# Patient Record
Sex: Male | Born: 1995 | Race: White | Hispanic: No | Marital: Single
Health system: Western US, Academic
[De-identification: ages and names within clinical notes are randomized; demographics above are authoritative.]

---

## 2019-11-08 ENCOUNTER — Emergency Department
Admission: EM | Admit: 2019-11-08 | Discharge: 2019-11-08 | Disposition: A | Discharge status: Home / Self Care | Payer: Medicaid Other | Attending: Emergency Medicine | Admitting: Emergency Medicine

## 2019-11-08 ENCOUNTER — Emergency Department (EMERGENCY_DEPARTMENT_HOSPITAL): Payer: Medicaid Other

## 2019-11-08 DIAGNOSIS — M79674 Pain in right toe(s): Secondary | ICD-10-CM

## 2019-11-08 DIAGNOSIS — W228XXA Striking against or struck by other objects, initial encounter: Secondary | ICD-10-CM

## 2019-11-08 DIAGNOSIS — S90111A Contusion of right great toe without damage to nail, initial encounter: Secondary | ICD-10-CM | POA: Insufficient documentation

## 2019-11-08 DIAGNOSIS — S99921A Unspecified injury of right foot, initial encounter: Secondary | ICD-10-CM | POA: Insufficient documentation

## 2019-11-08 DIAGNOSIS — W208XXA Other cause of strike by thrown, projected or falling object, initial encounter: Secondary | ICD-10-CM | POA: Insufficient documentation

## 2019-11-08 MED ORDER — ACETAMINOPHEN 325 MG PO TABS
975.0000 mg | ORAL_TABLET | Freq: Once | ORAL | Status: AC
Start: 2019-11-08 — End: 2019-11-08
  Administered 2019-11-08: 21:00:00 975 mg via ORAL
  Filled 2019-11-08: qty 3

## 2019-11-08 MED ORDER — IBUPROFEN 600 MG OR TABS
600.0000 mg | ORAL_TABLET | Freq: Once | ORAL | Status: AC
Start: 2019-11-08 — End: 2019-11-08
  Administered 2019-11-08: 600 mg via ORAL
  Filled 2019-11-08: qty 1

## 2019-11-08 NOTE — ED Provider Notes (Signed)
Emergency Department Note  [facility] electronic medical record reviewed for pertinent medical history.     Patient: Joseph Tapia, MRN [id number], DOB [birth year]  The Date of Service for the Emergency Room encounter is 11/08/2019  6:19 PM     Nursing Triage Note :   Chief Complaint   Patient presents with   . Toe Pain     pt to ER for eval of right big toe injury. pt reports dropping large pile of wood on toe; +bruising and increased pain.       HPI :   Joseph Tapia is a 24 year old male with no PMHx who presents with right toe swelling and pain after dropping a wood sheet on his toe 5 days ago. Denies other injuries. Patient states swelling and pain has improved since accident occurred. Able to ambulate and bare weight. No other injuries. No prior injuries to R foot. No open wounds.     Denies numbness/ tingling/ weakness/ cyanosis/ gait or balance issues    Denies F/ N/ V/ D/ diaphoresis/ chills/ CP/ palpitations/ SOB/ DOE/ PND/ orthopnea/ LE swelling/ cough/ congestion/ sore throat/ HA/ neck pain or stiffness/ photophobia/ blurry or double vision/ recent unexplained weight loss/ hematochezia/ dysuria/ hematuria/     Past Medical History : reviewed    Past Surgical history : reviewed    Family History: reviewed    Social History:  Social History     Tobacco Use   . Smoking status: Not on file   Substance Use Topics   . Alcohol use: Not on file   . Drug use: Not on file     Housed    Medications:   None       Allergies: Patient has no known allergies.    Review of Systems:  Complete review of 12 systems reviewed and negative unless otherwise noted in the HPI or above. This was done per my custom and practice for systems appropriate to the chief complaint in an emergency department setting and varies depending on the quality of history that the patient is able to provide. History reviewed today as available from records and EPIC chart.      Physical Exam:   11/08/19  1810   BP: 129/57   Pulse: 75   Resp: 16   Temp: 98.5  F (36.9 C)   SpO2: 96%       Nursing note and vitals reviewed. Pt not hypoxic.  Gen: Patient is in NAD, non-toxic appearing, cooperative, GCS 15, A/Ox4  HEENT: NC/AT, PERRL, EOMI, MMM, no conjunctival injection, b/l sclera anicteric, oropharynx clear, no LAD  Neck: Supple. No c-spine tenderness, no step offs or deformities; No meningeal signs.  Cardiovascular: RRR no m/r/g, normal heart sounds, no JVD  Pulmonary/Chest: CTAB, no increased WOB, no respiratory distress, no wheezes/rhonchi/rales, no chest wall tenderness.   Abdominal: Soft. NT/ND, +BS, no r/g, no masses   Back: No T/L-spine tenderness, no step-offs or deformities; no CVAT   Neuro: Awake, alert, and oriented. CN II-XII normal. Speech normal. Ambulates with steady gait.   Psychiatric: Normal affect. Mood not labile nor depressed.   Skin: No rashes, lesions, or wounds. +ecchymosis over R big toe    RLE: No gross deformities, abrasions, or other obvious signs of trauma; FROM and strength 5/5 throughout; neg logroll; neg straight leg raise; no knee/ankle/hip laxity; SILT sural/saphenous/sup peroneal/deep peroneal/post tib distributions; 2+ DP/PT pulse, cap refill <3secs; + ttp dorsal surface R big toe      ED  Course & Clinical Decision Making:  24 year old  male with PMHx as listed in HPI presents with R big toe pain, swelling, and ecchymosis after dropping a heavy object on his toe 5 days ago. Patient presents hemodynamically stable and with physical exam findings as stated above. Patient is neurovascularly intact, no open wounds. Able to ambulate independently.     DDx: R big toe sprain or fracture; neurovascular intact on exam, no open wounds to indicate open fracture, soft compartment without pallor and strong pulses making compartment syndrome unlikely at this time. Doubt osteomyelitis, isolated soft tissue infection, or septic joint. No apparent injury proximal to site of injury.     Initial workup to include XR imaging as listed below. Patient  provided PO analgesics as tolerating well. Will continue to reassess patient with serial neurovascular checks while in the ER and will adjust workup based on initial labs and imaging. No indication for emergent orthopedics consult in ER until initial imaging resulted. No signs of skin tenting or open fracture. Patient advised restricted movements of injury until reevaluation.     Orders Placed This Encounter   Procedures   . X-Ray Foot Complete Minimum 3 Views - Right     Medications   acetaminophen (TYLENOL) tablet 975 mg (975 mg Oral Given 11/08/19 2035)   ibuprofen (MOTRIN) tablet 600 mg (600 mg Oral Given 11/08/19 2035)       Diagnostic Imaging Studies  X-ray Foot Complete Minimum 3 Views - Right    Result Date: 11/08/2019  Narrative: EXAM DESCRIPTION: X-RAY FOOT COMPLETE MINIMUM 3 VIEWS - RIGHT CLINICAL HISTORY: R great toe injury TECHNIQUE: Three views of the right foot COMPARISON: None available. FINDINGS: No fracture, malalignment or joint effusion is seen. Soft tissues are unremarkable. Os peroneum is noted. CONCURRENT SUPERVISION: I have reviewed the images and agree with the fellow's report.    Preliminary created by: Kirstie Mirza Signed by: Leonette Monarch 11/08/2019 20:43:46    Impression: IMPRESSION: No radiographic evidence of acute osseous abnormality.       Workup Review as of Nov 07 2046   Forestine Chute Tiptanya's Documentation   Tue Nov 08, 2019   2026 XR R foot: Radpre: No radiographic evidence of acute osseous abnormality.         - provided with pain meds and hard sole shoe  - follow up with PCP  - I reevaluated the patient. The patient was discharged home, status improved, with instructions regarding supportive care, medications, and appropriate follow up. I discussed indications for seeking immediate medical attention, and the patient voiced understanding. The patient was discharged home in stable condition after all questions were answered.        I have discussed my thought process and plan for the  patient with the attending physician, Laverle Patter,*.    Forestine Chute, MD  Emergency Medicine Resident, PGY2  [facility] [place], Nestor Ramp, MD  Resident  11/08/19 2325       Laverle Patter, MD  11/14/19 Einar Crow

## 2019-11-08 NOTE — Discharge Instructions (Signed)
Your x-ray was negative for a fracture. Please wear the hard sole shoe and bear weight as tolerated. Please follow up with your primary care doctor    Please return to the Emergency Department if you experience chest pain, shortness of breath, fever, chills, or you have any other concerns.

## 2020-06-06 IMAGING — MR MRI BRAIN WO CONTRAST
7 series · 48 of 48 positions shown · non-contrast
Comparison: None.

HISTORY: Headaches
TECHNIQUE: Multiplanar, multisequential MRI images of the brain are obtained without intravenous contrast.

[Series 5: t1_sag · sagittal · 5.0mm · 0.72mm/px · 6 of 25 slices shown]
[im 1/25]
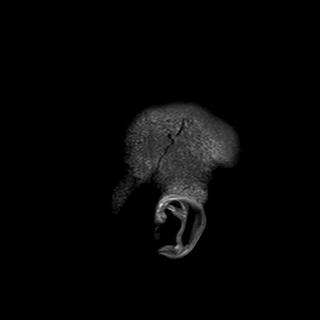
[im 5/25]
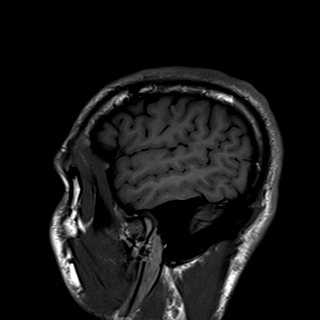
[im 10/25]
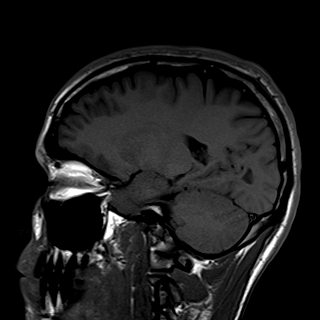
[im 15/25]
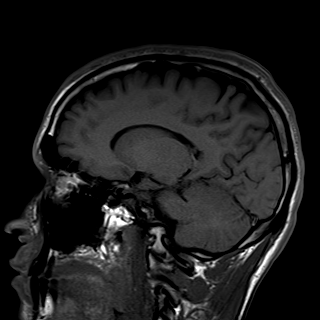
[im 20/25]
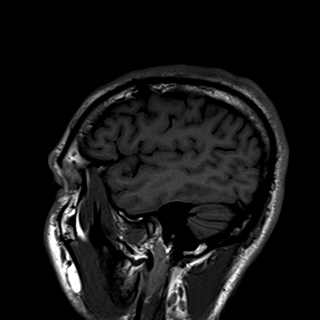
[im 25/25]
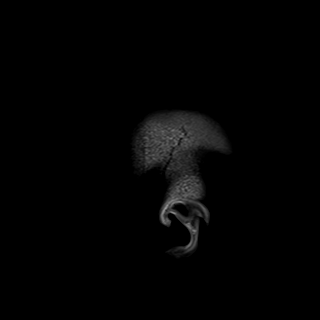

[Series 6: flair_axial_fs · axial · 4.0mm · 0.90mm/px · z∈[+51,+201]mm · 7 of 30 slices shown]
[im 1/30]
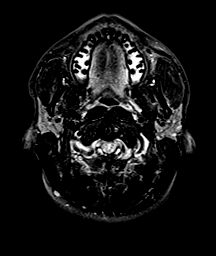
[im 5/30]
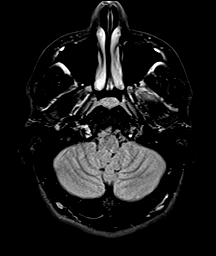
[im 10/30]
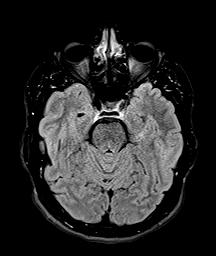
[im 15/30]
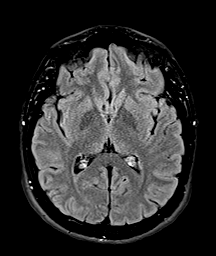
[im 20/30]
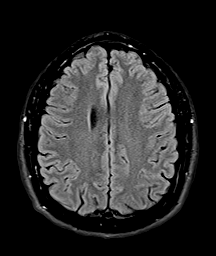
[im 25/30]
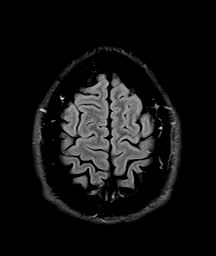
[im 30/30]
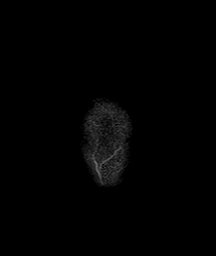

[Series 7: t2_axial · axial · 4.0mm · 0.36mm/px · z∈[+51,+201]mm · 7 of 30 slices shown]
[im 1/30]
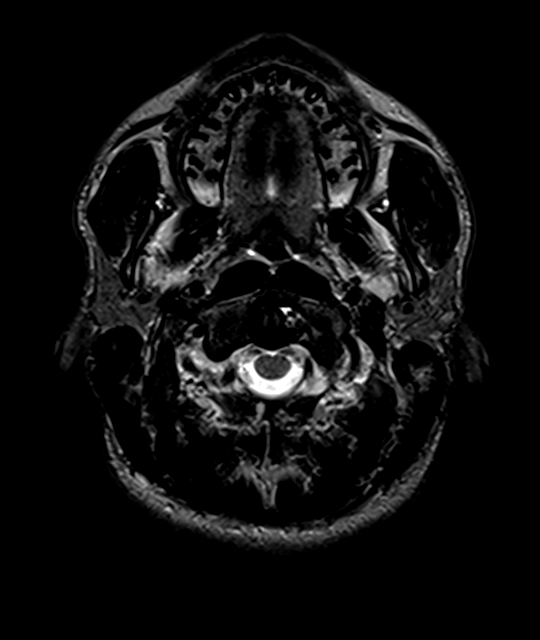
[im 5/30]
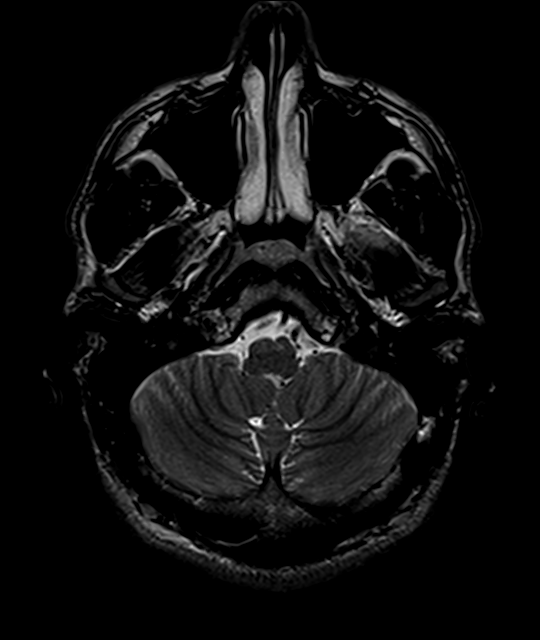
[im 10/30]
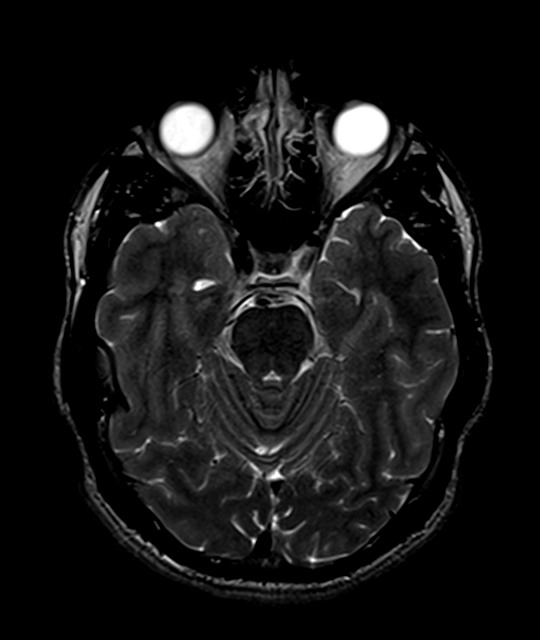
[im 15/30]
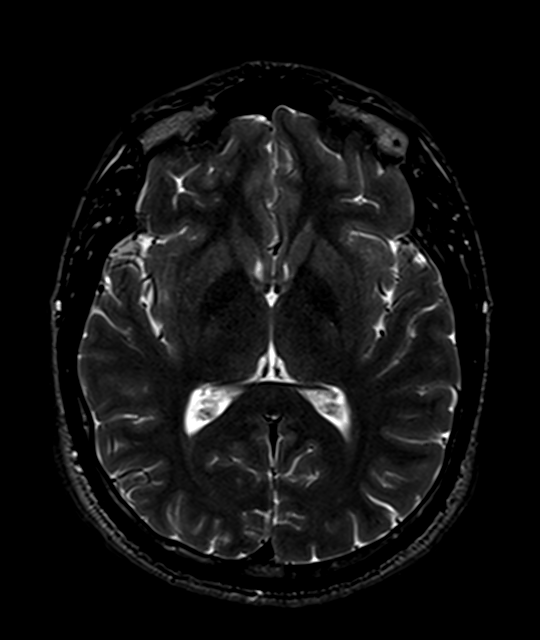
[im 20/30]
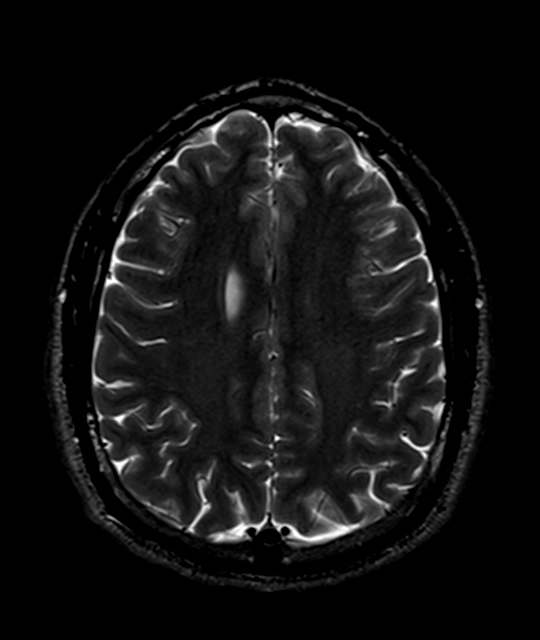
[im 25/30]
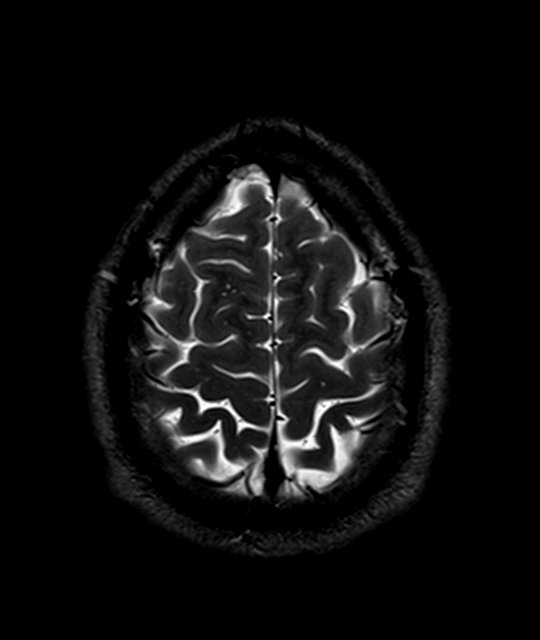
[im 30/30]
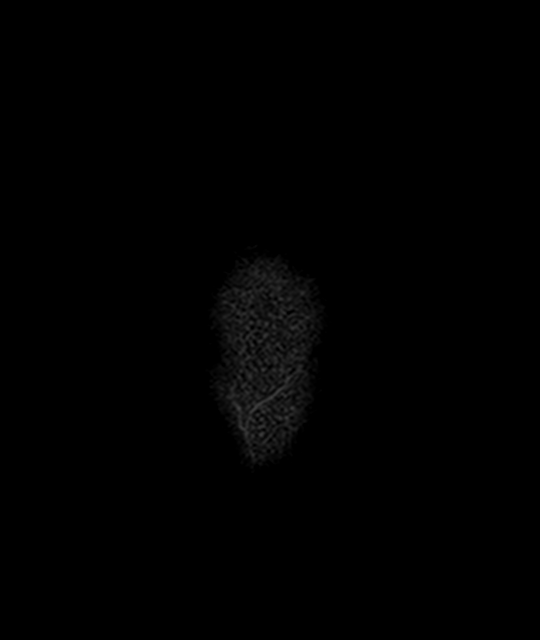

[Series 8: t1_axial · axial · 4.0mm · 0.72mm/px · z∈[+51,+201]mm · 7 of 30 slices shown]
[im 1/30]
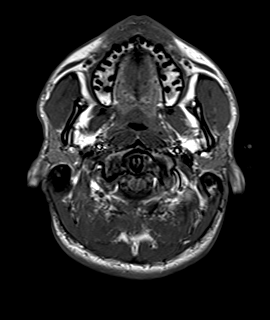
[im 5/30]
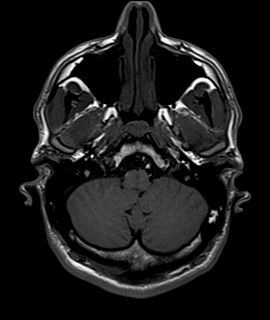
[im 10/30]
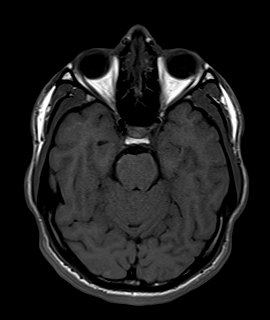
[im 15/30]
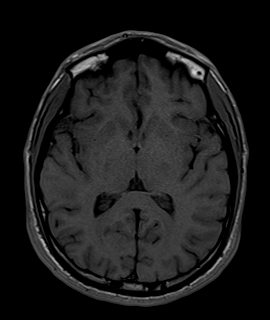
[im 20/30]
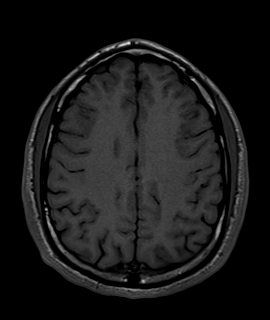
[im 25/30]
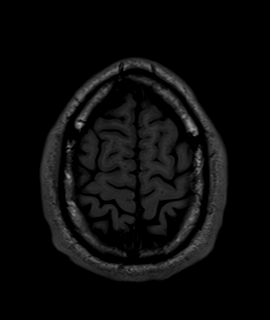
[im 30/30]
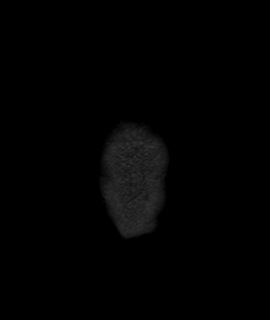

[Series 9: DWI · axial · 4.0mm · 1.31mm/px · z∈[+51,+201]mm · 7 of 30 slices shown (1 of 2)]
[im 1/30]
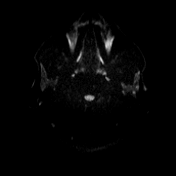
[im 5/30]
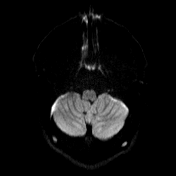
[im 10/30]
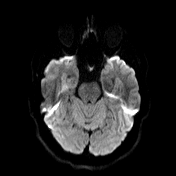
[im 15/30]
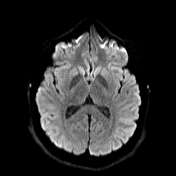
[im 20/30]
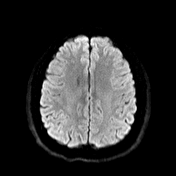
[im 25/30]
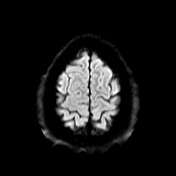
[im 30/30]
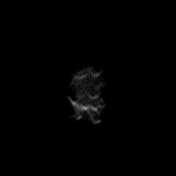

[Series 10: DWI · axial · 4.0mm · 1.31mm/px · z∈[+51,+201]mm · 7 of 30 slices shown (2 of 2)]
[im 1/30]
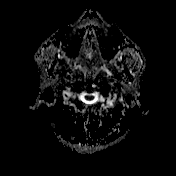
[im 5/30]
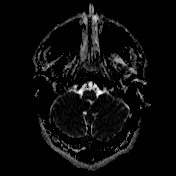
[im 10/30]
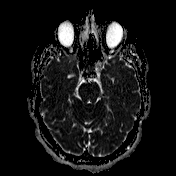
[im 15/30]
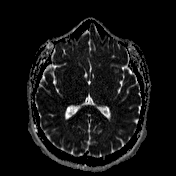
[im 20/30]
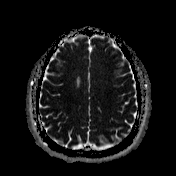
[im 25/30]
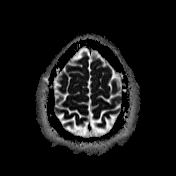
[im 30/30]
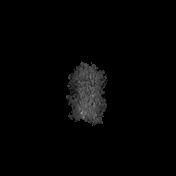

[Series 11: flash_axial · axial · 4.0mm · 0.90mm/px · z∈[+51,+201]mm · 7 of 30 slices shown]
[im 1/30]
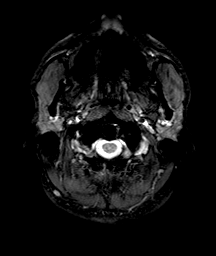
[im 5/30]
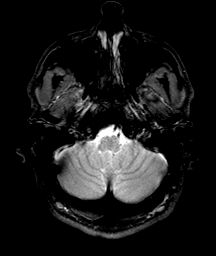
[im 10/30]
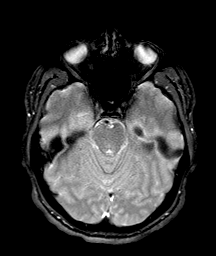
[im 15/30]
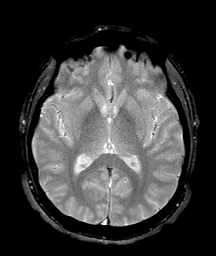
[im 20/30]
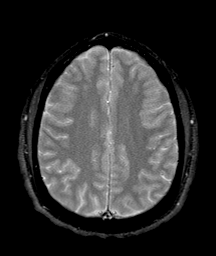
[im 25/30]
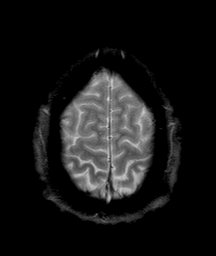
[im 30/30]
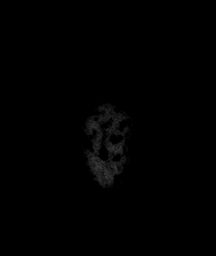

[48 of 48 positions shown; findings below may reference images not displayed]

FINDINGS: Ventricles and cortical sulci are normal. There is no abnormal intra-axial signal. Midline structures are normal. Flow voids of main intracranial vasculature are normal. There is no mass lesion or midline shift. No extra-axial fluid collection is identified. No area of restricted diffusion or hemosiderin deposit seen. Mucosal thickening of ethmoid air cells and maxillary sinuses seen. The rest of the orbits, paranasal sinuses, mastoid air cells and skull marrow signal are normal.
IMPRESSION: Mild chronic paranasal sinusitis seen.

No other acute disease.

## 2020-06-06 IMAGING — MR MRI LSPINE WO CONTRAST
5 series · 32 of 48 positions shown · non-contrast
Comparison: None

INDICATION: Low back pain with right lower extremity radiculopathy.
TECHNIQUE: MRI lumbar spine without contrast. Sagittal and axial multisequence MR images of the lumbar spine were performed without intravenous contrast with additional coronal T1-weighted images.

[Series 16: t2_sag · sagittal · 4.0mm · 0.80mm/px · 6 of 16 slices shown]
[im 1/16]
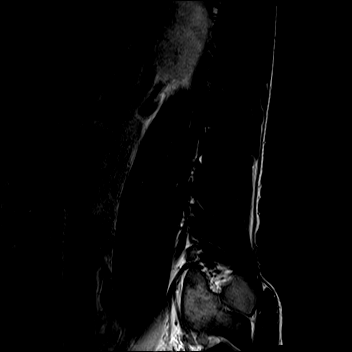
[im 4/16]
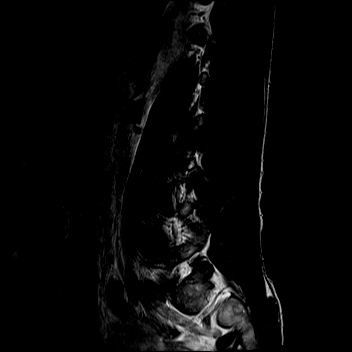
[im 7/16]
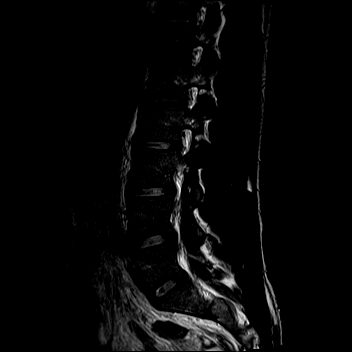
[im 10/16]
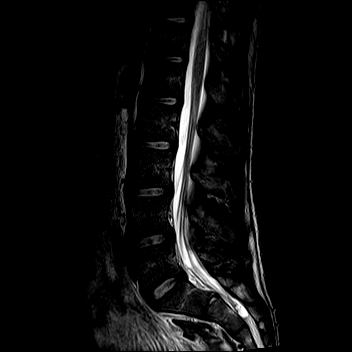
[im 13/16]
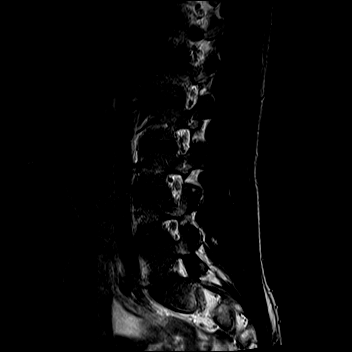
[im 16/16]
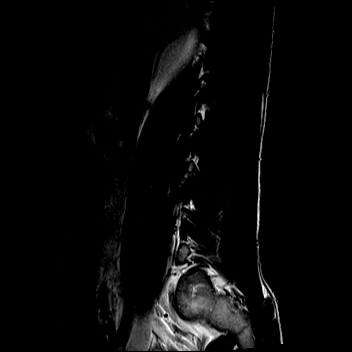

[Series 17: t1_sag · sagittal · 4.0mm · 0.88mm/px · 7 of 16 slices shown]
[im 1/16]
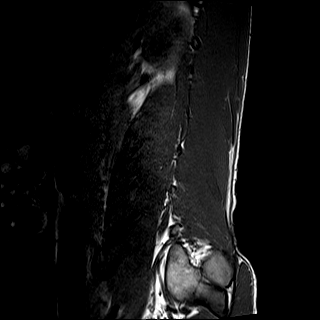
[im 3/16]
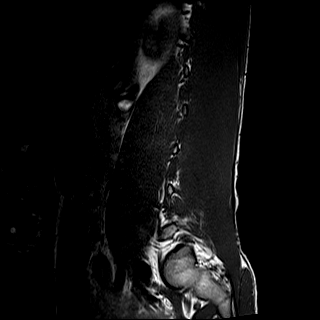
[im 6/16]
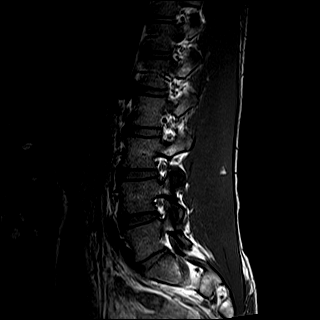
[im 8/16]
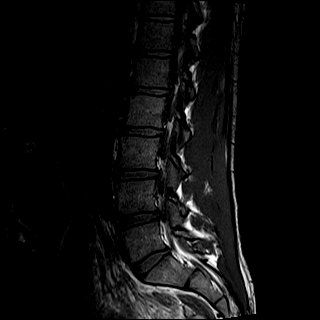
[im 11/16]
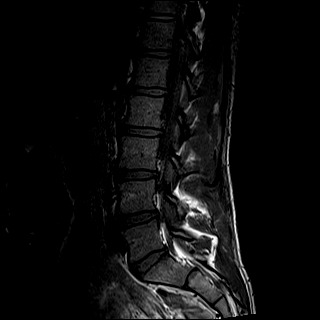
[im 13/16]
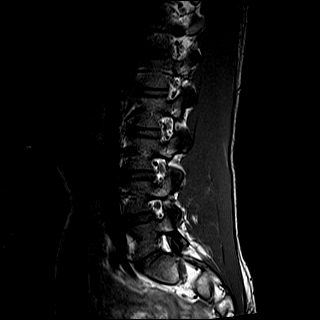
[im 16/16]
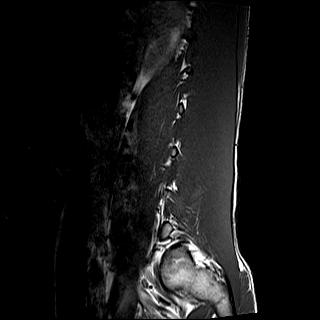

[Series 18: ir_sag · sagittal · 4.0mm · 0.49mm/px · 7 of 16 slices shown]
[im 1/16]
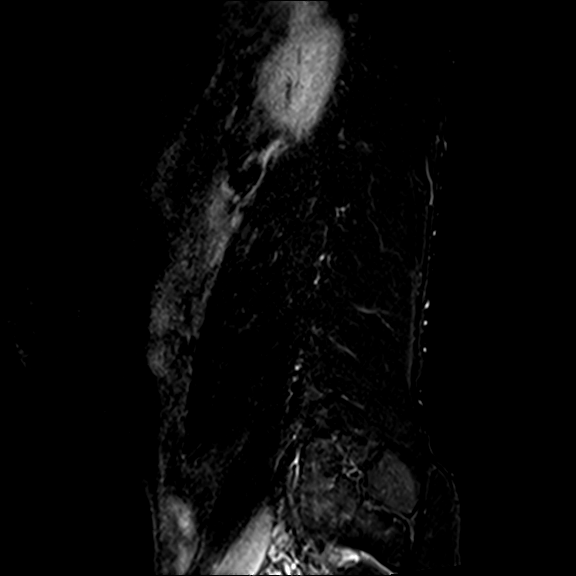
[im 3/16]
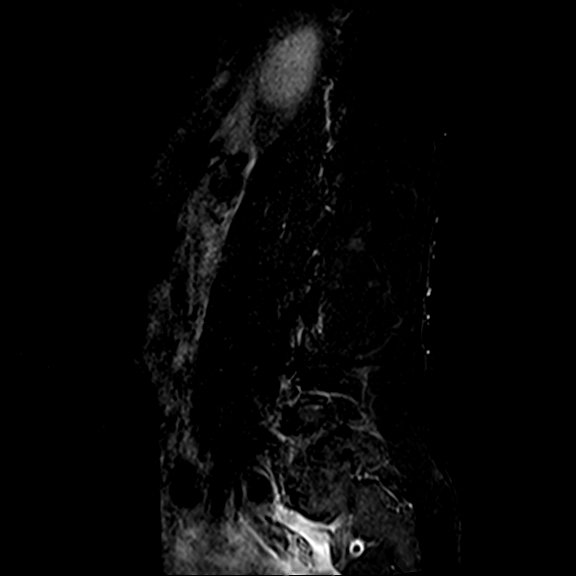
[im 6/16]
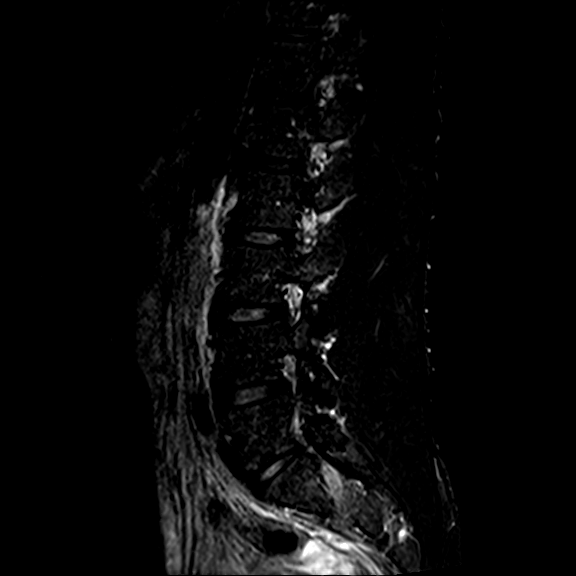
[im 8/16]
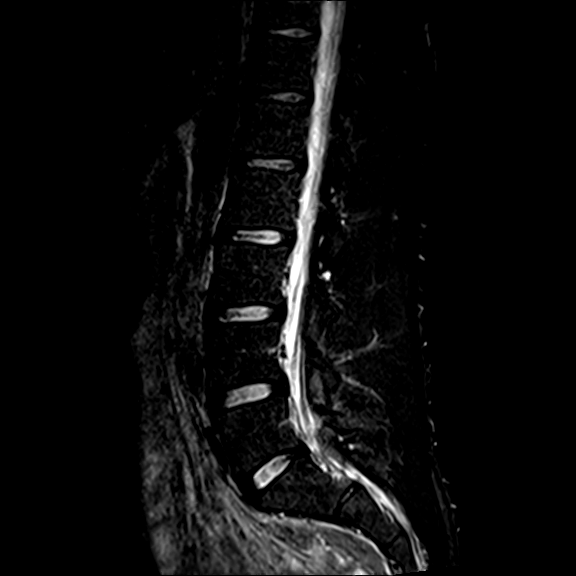
[im 11/16]
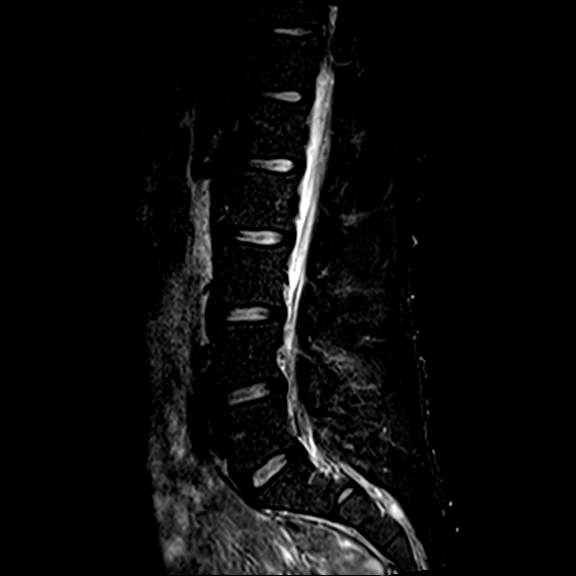
[im 13/16]
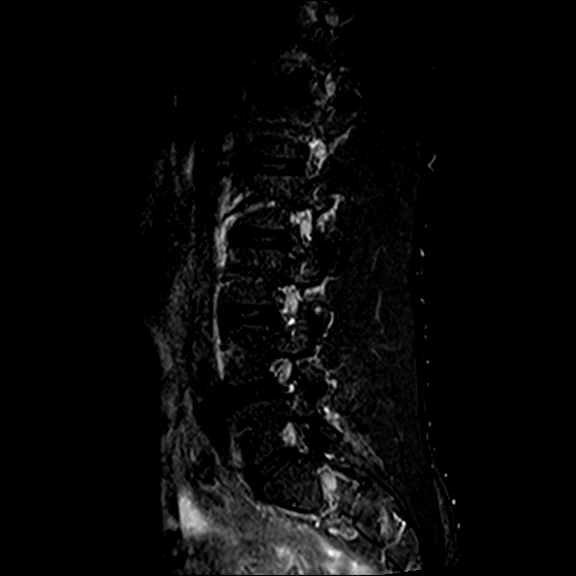
[im 16/16]
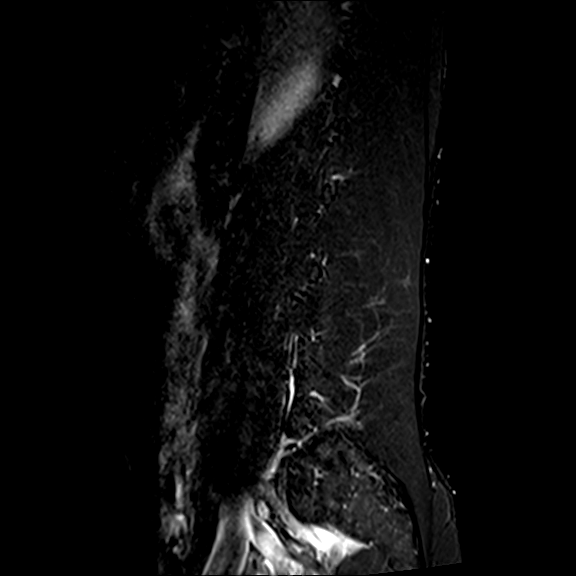

[Series 19: t2_axial · axial · 4.0mm · 0.62mm/px · z∈[-526,-357]mm · 11 of 40 slices shown]
[im 3/40]
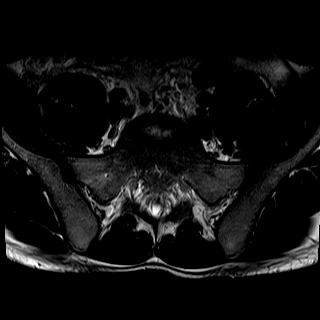
[im 5/40]
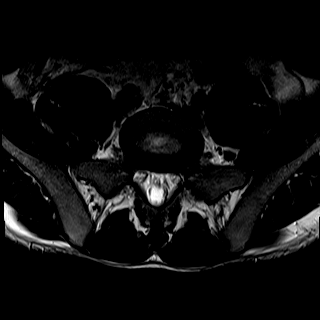
[im 8/40]
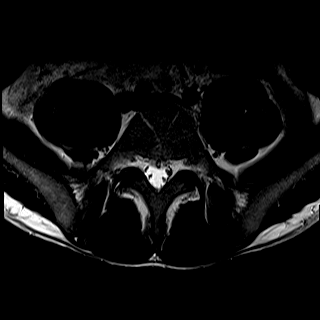
[im 13/40]
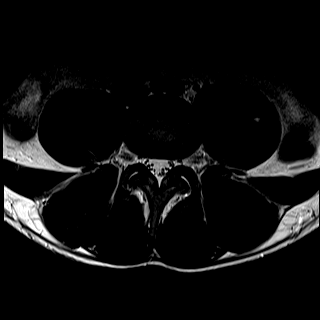
[im 18/40]
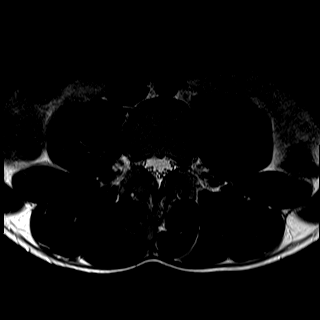
[im 20/40]
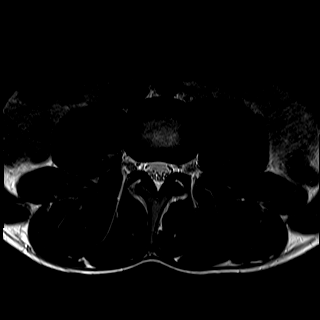
[im 22/40]
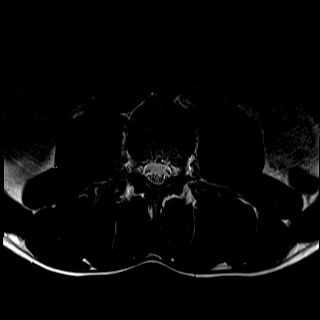
[im 27/40]
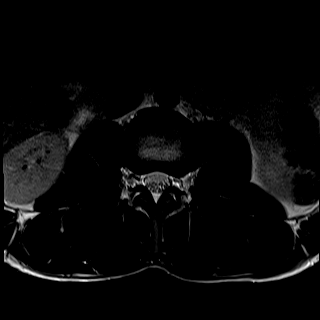
[im 32/40]
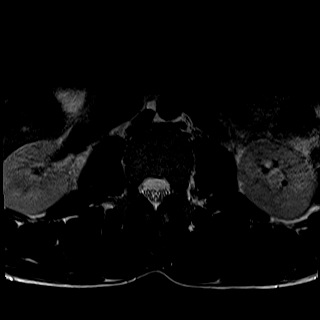
[im 35/40]
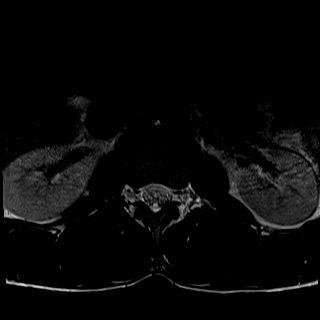
[im 37/40]
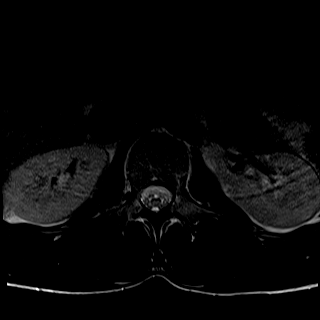

[Series 20: t1_axial_obl · axial · 3.0mm · 0.78mm/px · 1 of 26 slices shown]
[im 1/26]
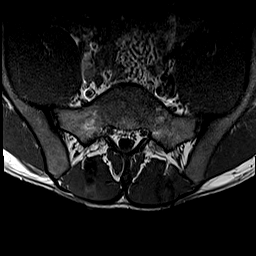

[32 of 48 positions shown; findings below may reference images not displayed]

FINDINGS: From the lateral scout image annotation patient has 7 cervical vertebrae, 12 thoracic vertebrae and 5 lumbar-type vertebrae.

There is straightening of the normal lumbar lordosis which may be positional due to muscle test. Signal intensity of the bone marrow and distal neural cord is normal with normal conus ending at L1-L2 interspace level. Cauda equina is normal. Spinal canal is of normal caliber measuring approximately 13.62 mm anteroposteriorly.

From T11-T12 down to L3-L4 there is no evidence of disc herniation, spinal canal or foraminal stenosis.

At L4-L5 there is a disc bulge with mild bilateral degenerative changes of the facets producing no significant spinal canal or foraminal stenosis.

At L5-S1 there are mild bilateral degenerative changes of the facets producing no significant spinal canal or foraminal stenosis.
IMPRESSION: Mild bilateral degenerative changes of the facets at L4-L5 and L5-S1. No disc herniation, spinal canal or foraminal stenosis is seen.

## 2020-08-21 IMAGING — MR MRI HIP RT WO CONTRAST
7 series · 40 of 40 positions shown · non-contrast
Comparison: None.

INDICATION: Pain in right hip.
TECHNIQUE: MRI of the right hip - labrum protocol. Multiplanar, multiecho imaging of the hip was performed, including T1-weighted and fluid sensitive sequences.

[Series 1: labral · axial · right · 6.0mm · 0.78mm/px · z∈[-36,+200]mm · 3 of 12 slices shown]
[im 1/12]
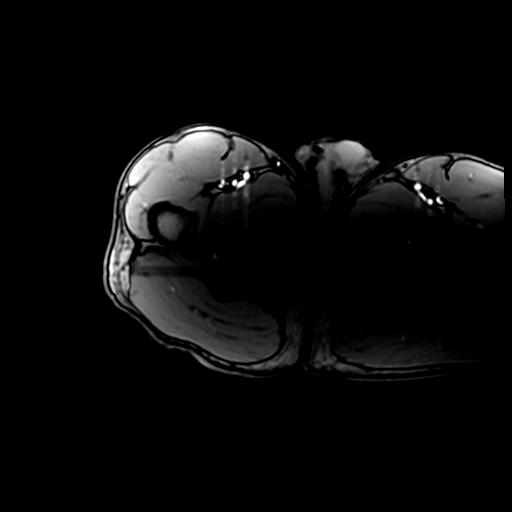
[im 6/12]
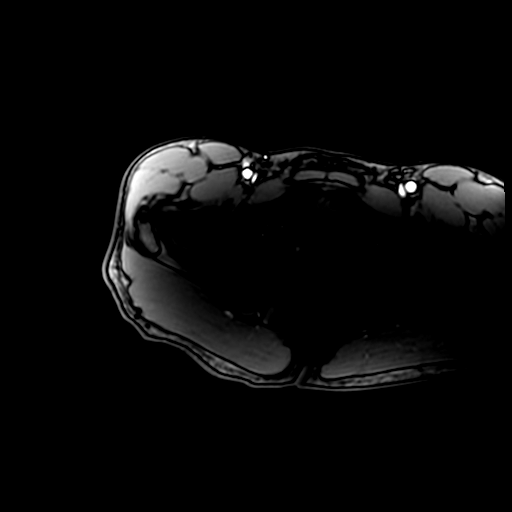
[im 12/12]
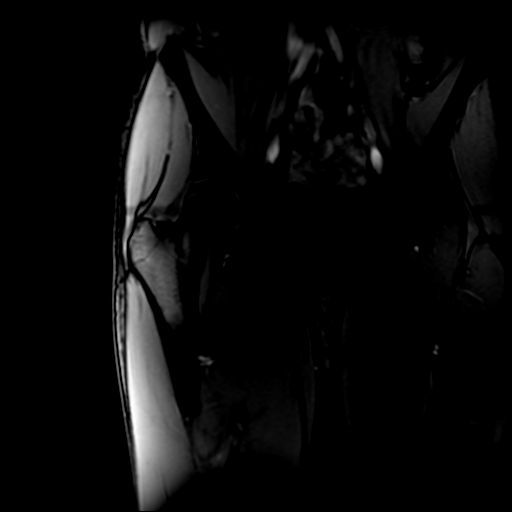

[Series 2: t2_axial_fs · axial · right · 4.0mm · 0.62mm/px · z∈[-49,+136]mm · 9 of 38 slices shown (1 of 2)]
[im 1/38]
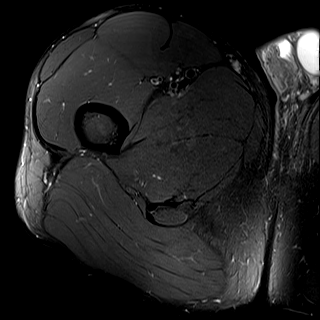
[im 5/38]
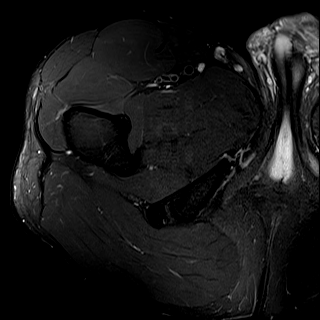
[im 10/38]
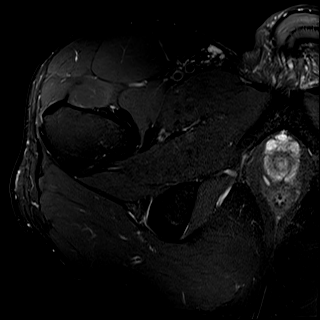
[im 14/38]
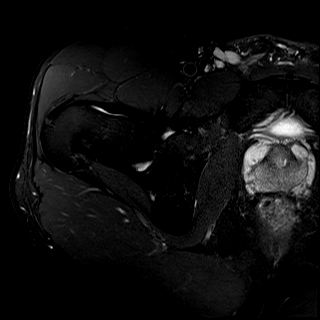
[im 19/38]
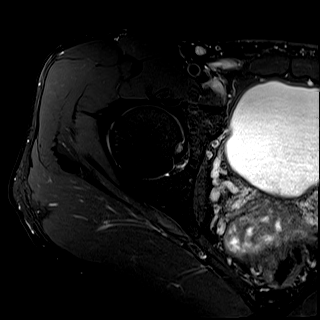
[im 24/38]
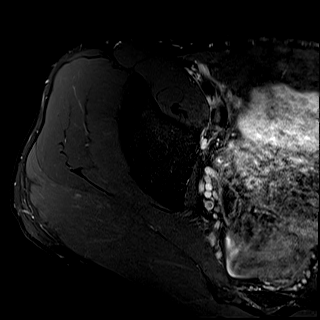
[im 28/38]
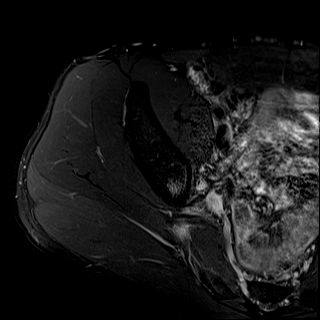
[im 33/38]
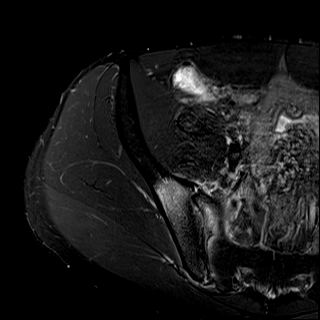
[im 38/38]
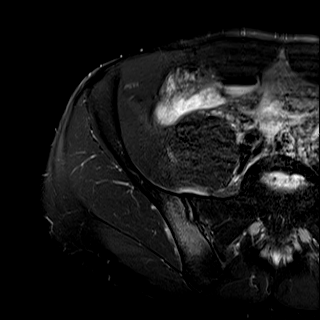

[Series 3: t1_cor_obl · coronal · right · 3.0mm · 0.56mm/px · 5 of 24 slices shown]
[im 1/24]
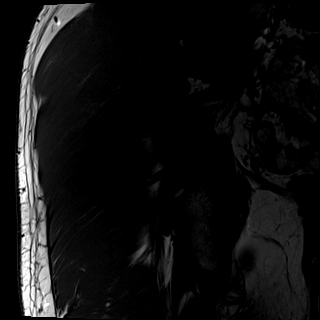
[im 6/24]
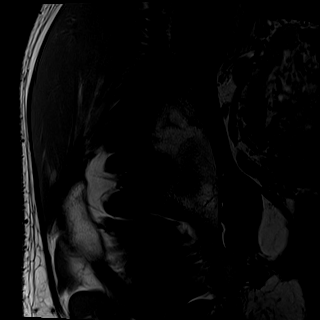
[im 12/24]
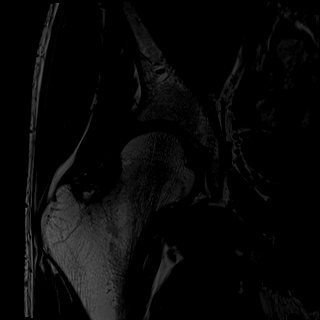
[im 18/24]
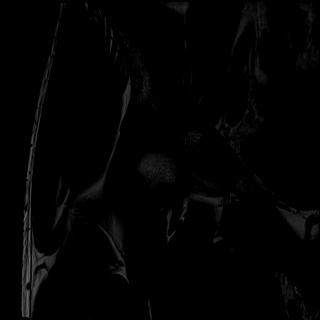
[im 24/24]
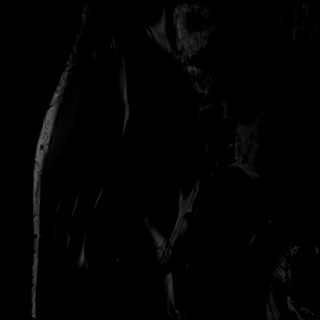

[Series 4: pd_cor_obl_fs · coronal · right · 3.0mm · 0.56mm/px · 5 of 24 slices shown]
[im 1/24]
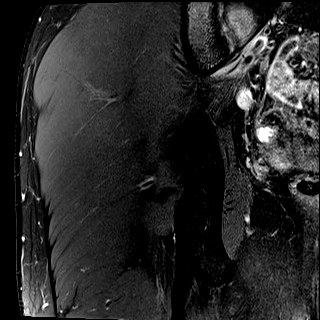
[im 6/24]
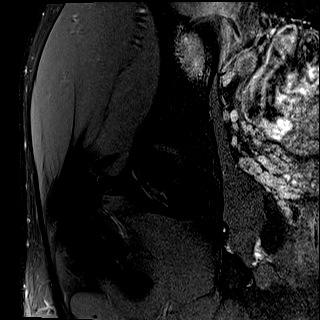
[im 12/24]
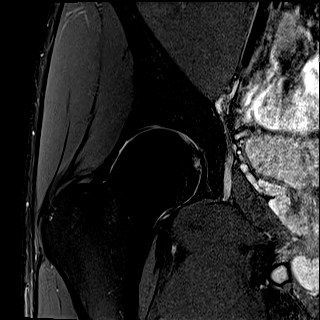
[im 18/24]
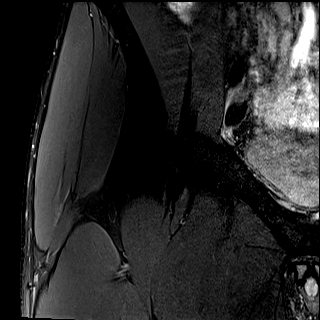
[im 24/24]
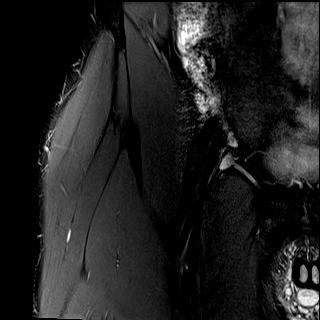

[Series 5: pd_sag_obl_fs · sagittal · right · 3.0mm · 0.56mm/px · 6 of 28 slices shown]
[im 1/28]
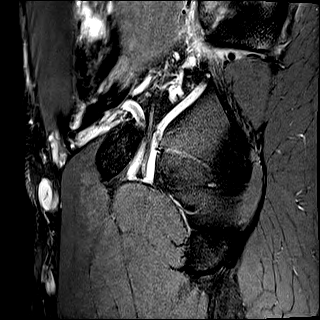
[im 6/28]
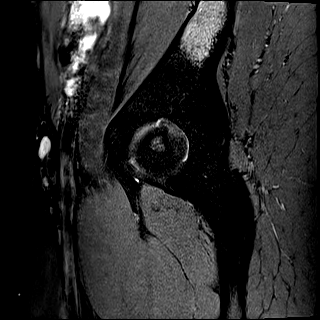
[im 11/28]
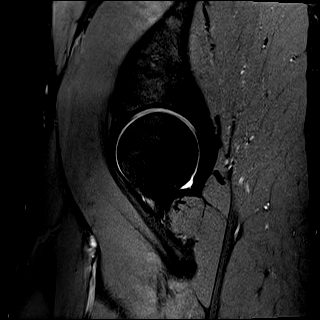
[im 17/28]
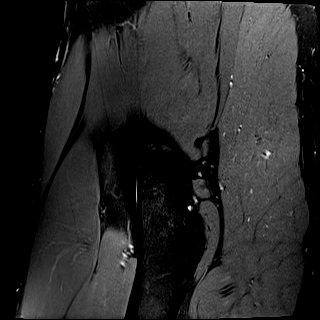
[im 22/28]
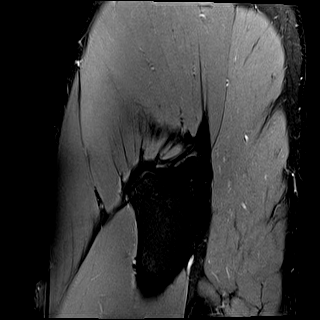
[im 28/28]
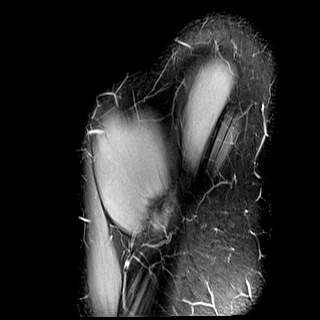

[Series 6: pd_axial_obl_fs · oblique · right · 3.0mm · 0.56mm/px · 5 of 22 slices shown]
[im 1/22]
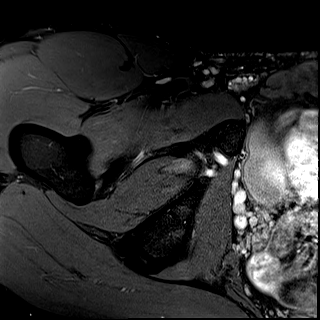
[im 6/22]
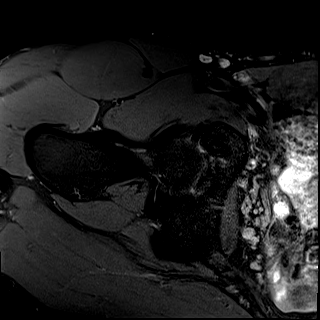
[im 11/22]
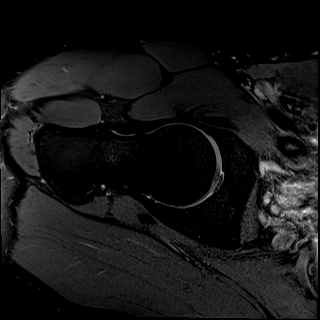
[im 16/22]
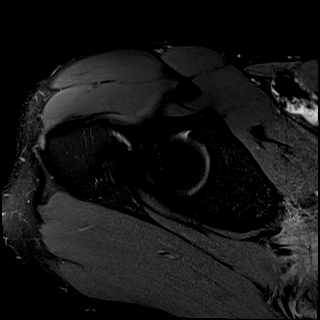
[im 22/22]
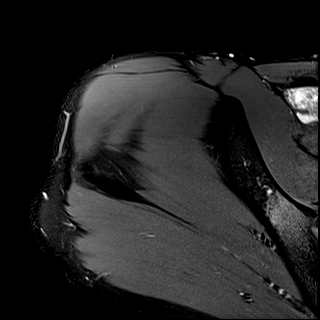

[Series 8: t2_axial_fs · axial · right · 4.0mm · 0.78mm/px · z∈[-22,+105]mm · 7 of 29 slices shown (2 of 2)]
[im 1/29]
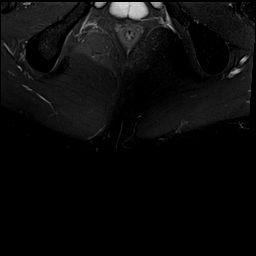
[im 5/29]
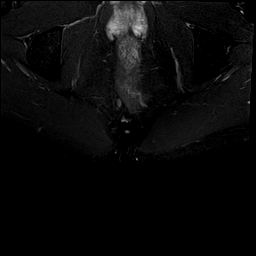
[im 10/29]
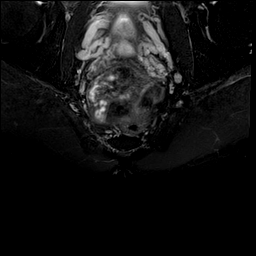
[im 15/29]
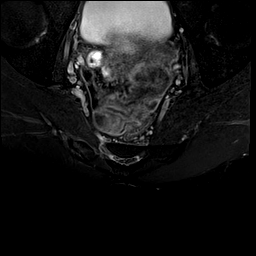
[im 19/29]
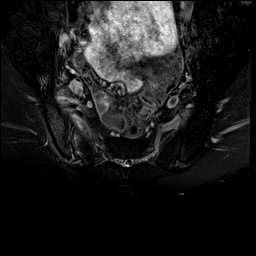
[im 24/29]
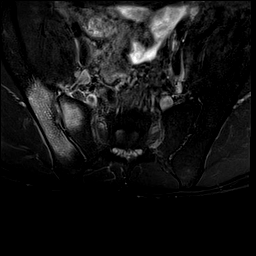
[im 29/29]
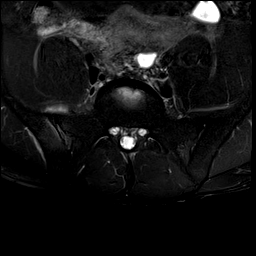

[40 of 40 positions shown; findings below may reference images not displayed]

FINDINGS: Acetabular labrum: Intact.

Cartilage: Intact.

Marrow: Marked abnormal marrow edema about the right SI joint. No marrow signal abnormality about the left SI joint. No fracture. No femoral head AVN. No erosion or ankylosis of the right SI joint. Pericapsular and periosteal edema about the right SI joint.

Osseous morphology: Minimal cam morphology of the proximal right femur. Normal coverage of the femoral head by the acetabulum. Normal acetabular version.

Gluteal tendons and trochanteric bursa: Intact and normal in appearance.

Other visualized tendons: Other visualized tendons are intact and normal in appearance.

Soft tissues: Other visualized soft tissues are unremarkable.

No free fluid in the pelvis. No right inguinal adenopathy or hernia.
IMPRESSION: 1. Severe sacroiliitis of the right SI joint with marked abnormal marrow signal and no erosion/ankylosis. Normal left SI joint. Pericapsular and periosteal edema about the right SI joint. Infection/septic arthritis must be excluded (i.e. IV drug use). Other potential etiologies include psoriatic arthritis, gout, reactive arthritis, and Susant disease.

2. Normal right hip joint.

STAT Fax

## 2022-01-07 IMAGING — CR SACROILIAC JNTS 3 VWS MIN
1 series · 3 of 3 positions shown · non-contrast
Comparison: None.

SI joint radiographs, 3 views
INDICATION: Inflammatory spondylopathy.

[Series 1: AP · 0.17mm/px · 3 of 3 slices shown]
[im 1/3]
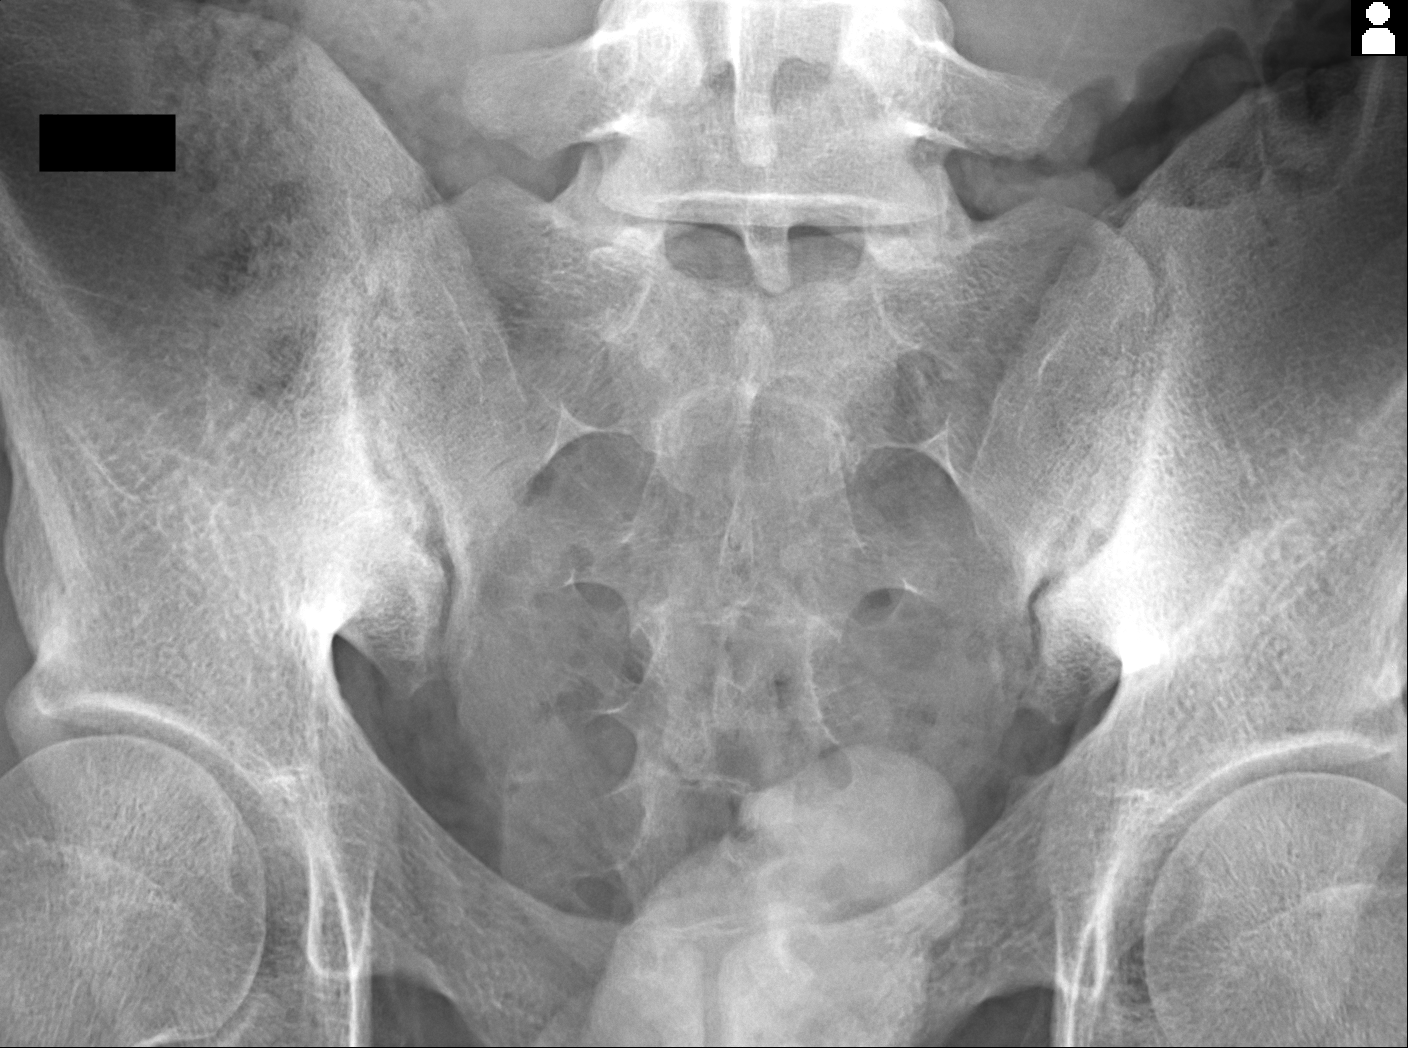
[im 2/3]
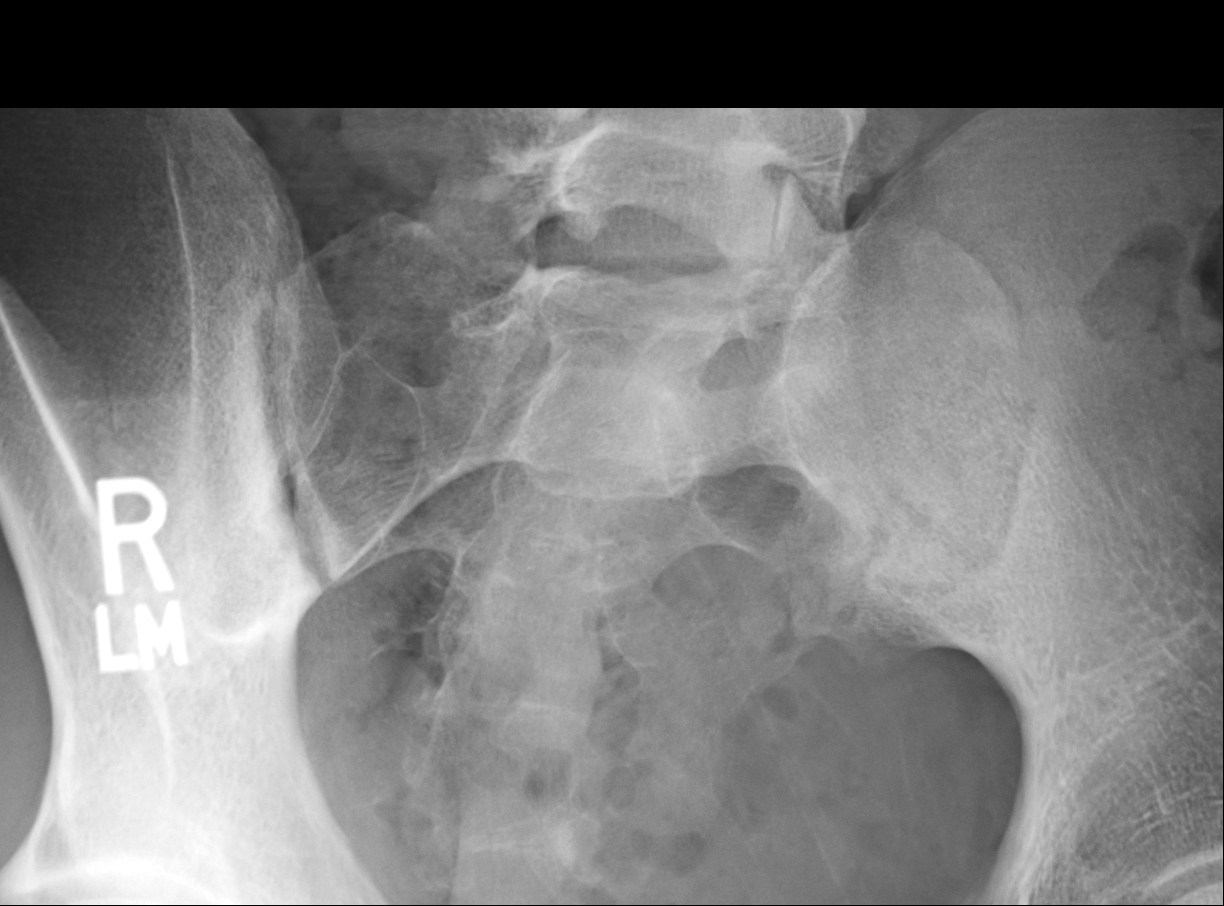
[im 3/3]
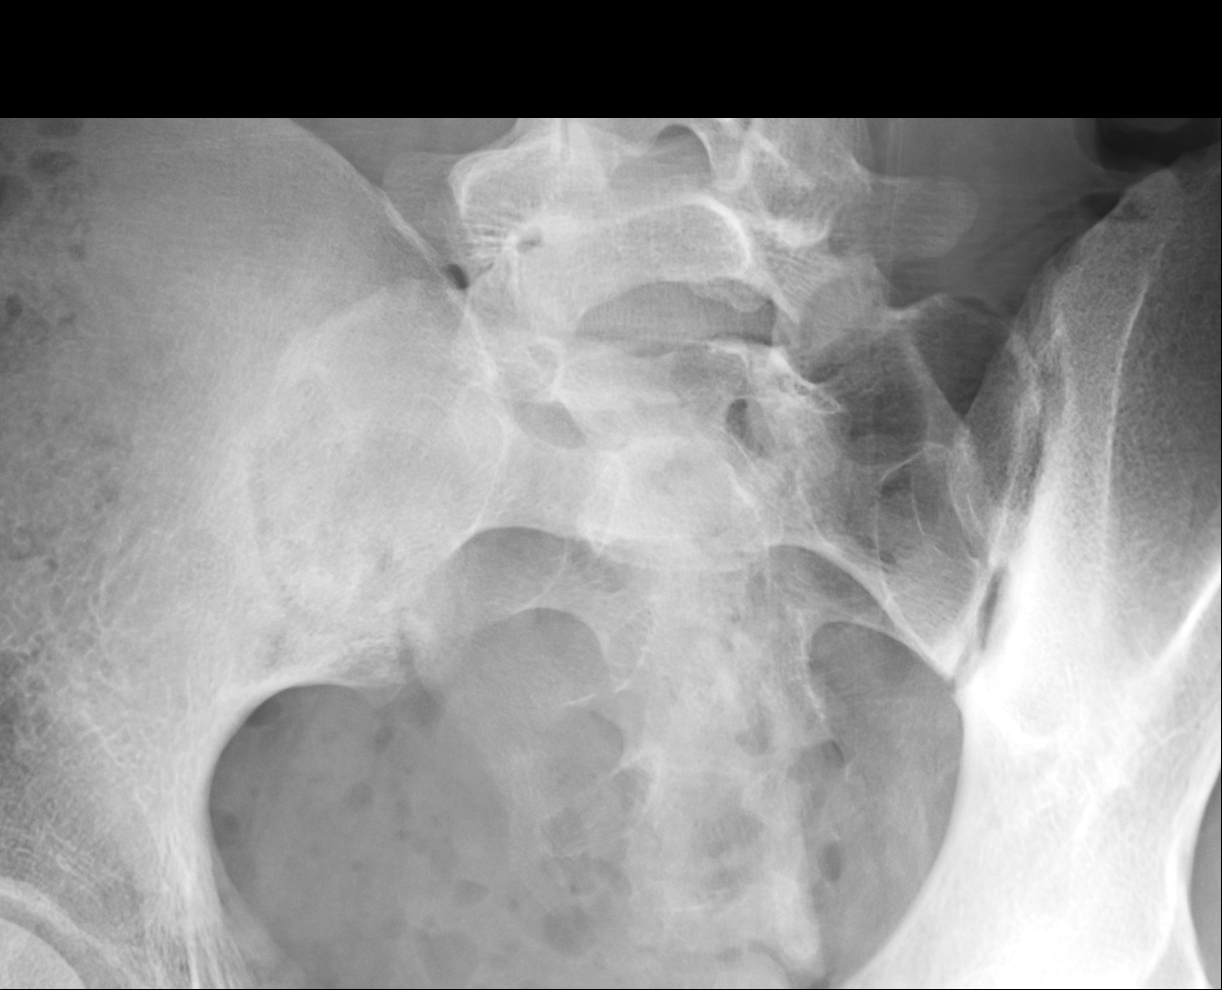

[3 of 3 positions shown; findings below may reference images not displayed]

FINDINGS: Mild-moderate DJD both SI joints with subchondral sclerosis. No erosion. No ankylosis. No fracture.
IMPRESSION: Mild-moderate DJD of both SI joints with subchondral sclerosis.

## 2022-01-07 IMAGING — CR C-SPINE 4 or 5 views
1 series · 6 of 6 positions shown · non-contrast
Comparison: None.

Cervical spine radiographs, 5 views
INDICATION: Cervicalgia.

[Series 1: lat · 0.17mm/px · 6 of 6 slices shown]
[im 1/6]
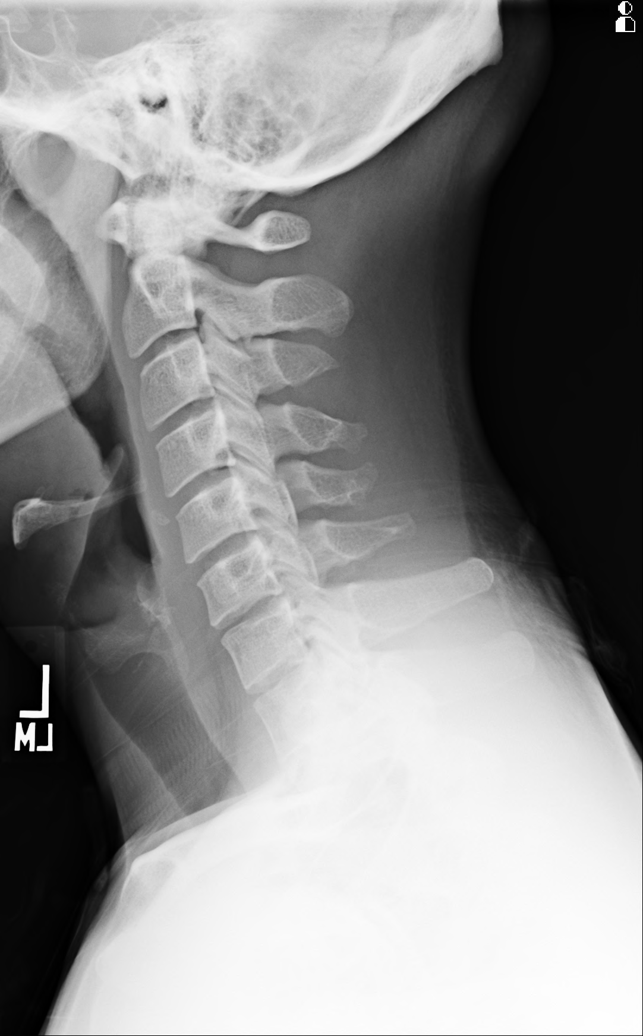
[im 2/6]
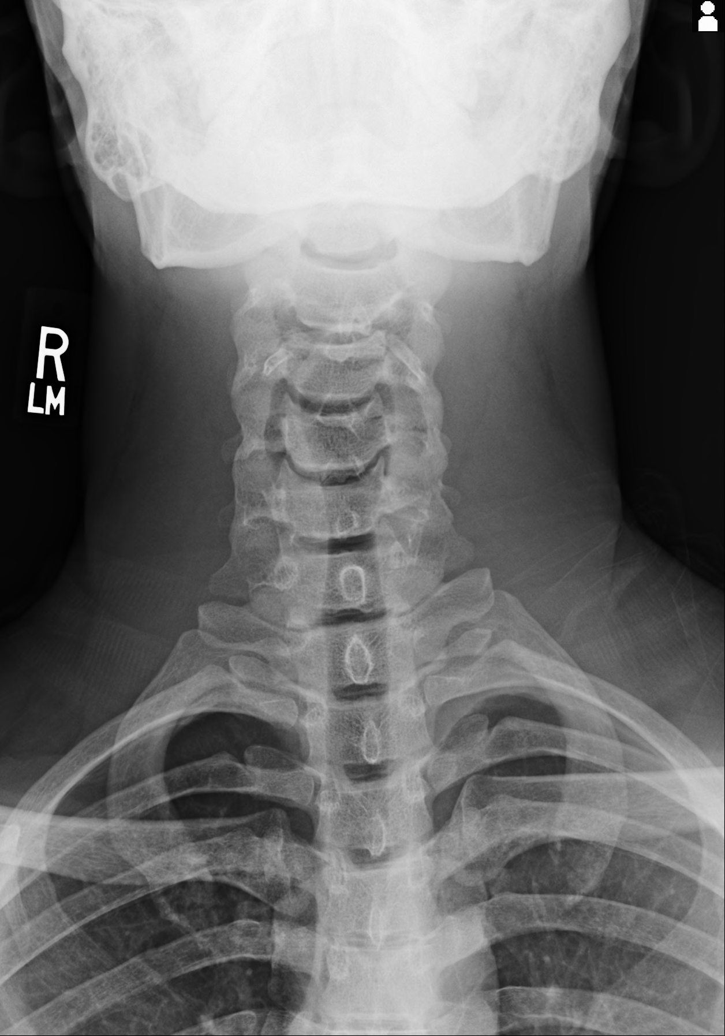
[im 3/6]
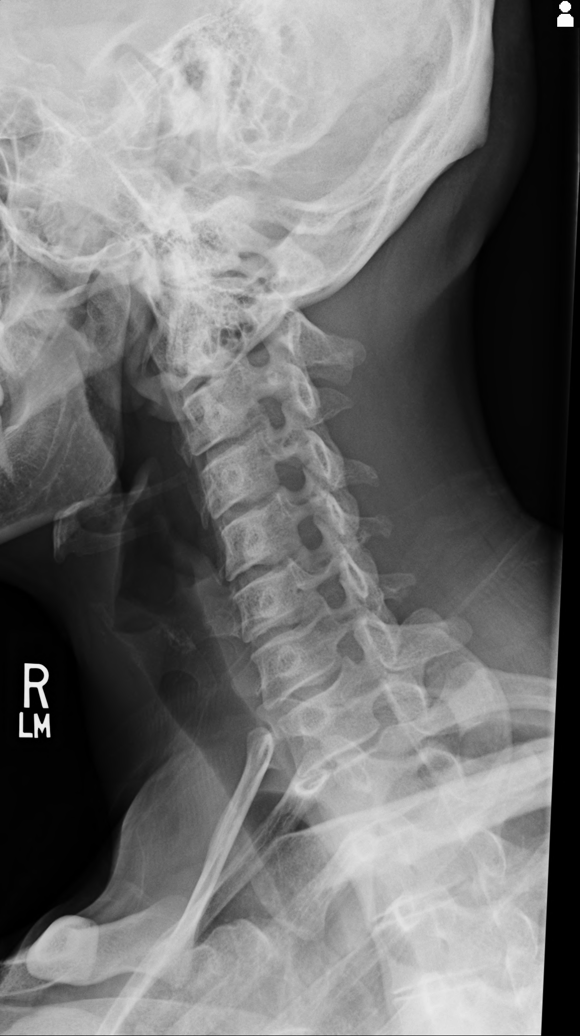
[im 4/6]
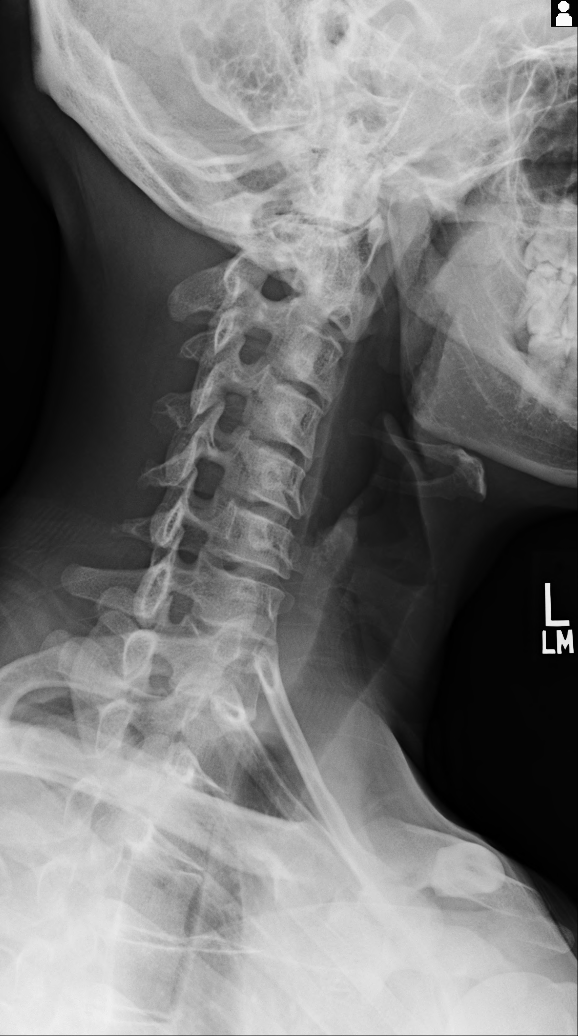
[im 5/6]
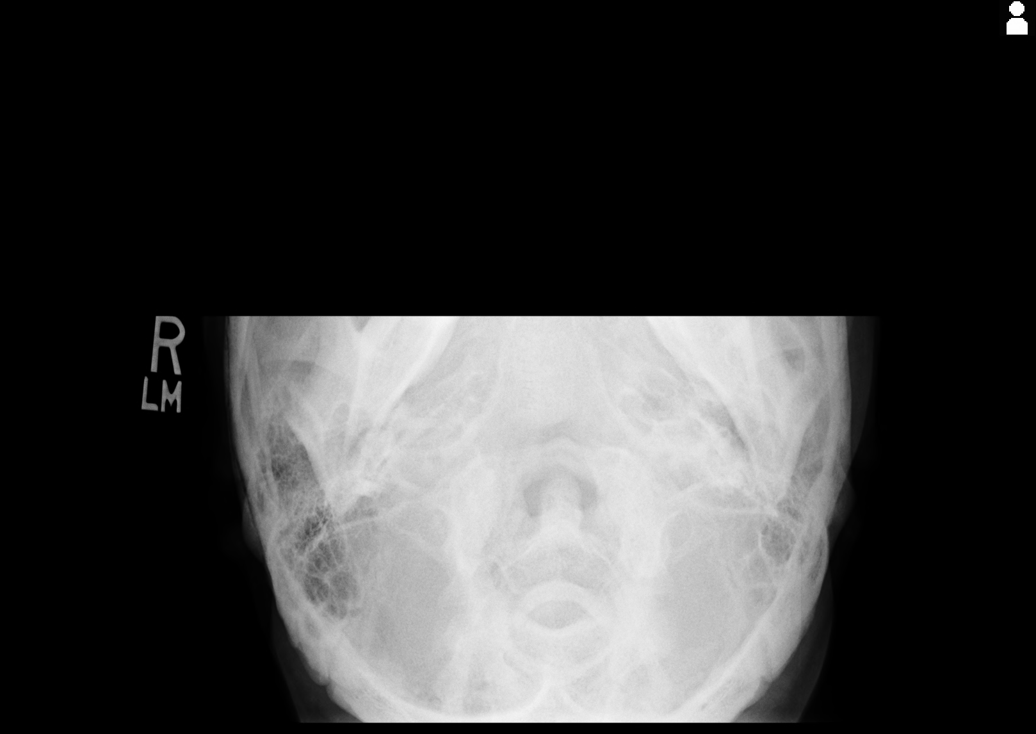
[im 6/6]
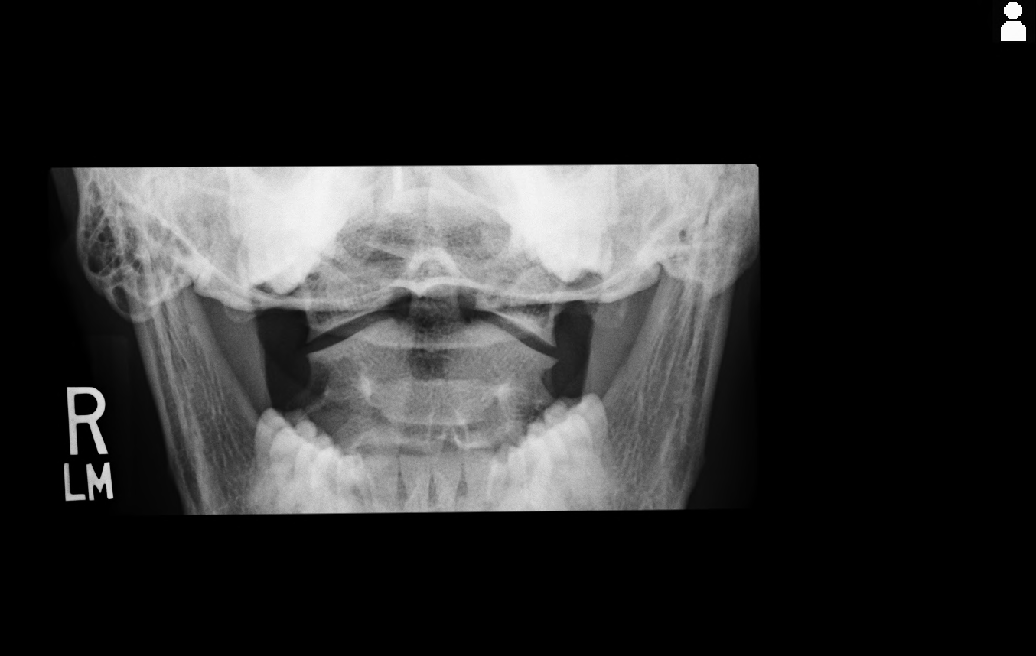

[6 of 6 positions shown; findings below may reference images not displayed]

FINDINGS: No acute fracture or dislocation. Disc heights are intact. No facet arthrosis. Prevertebral soft tissues are unremarkable.
IMPRESSION: Unremarkable cervical spine radiographs.

## 2022-01-07 IMAGING — CR L-SPINE 4 VWS MIN
1 series · 5 of 5 positions shown · non-contrast
Comparison: None.

Lumbar spine radiographs, 5 views
INDICATION: Inflammatory spondylopathy, lumbar region.

[Series 1: AP · 0.17mm/px · 5 of 5 slices shown]
[im 1/5]
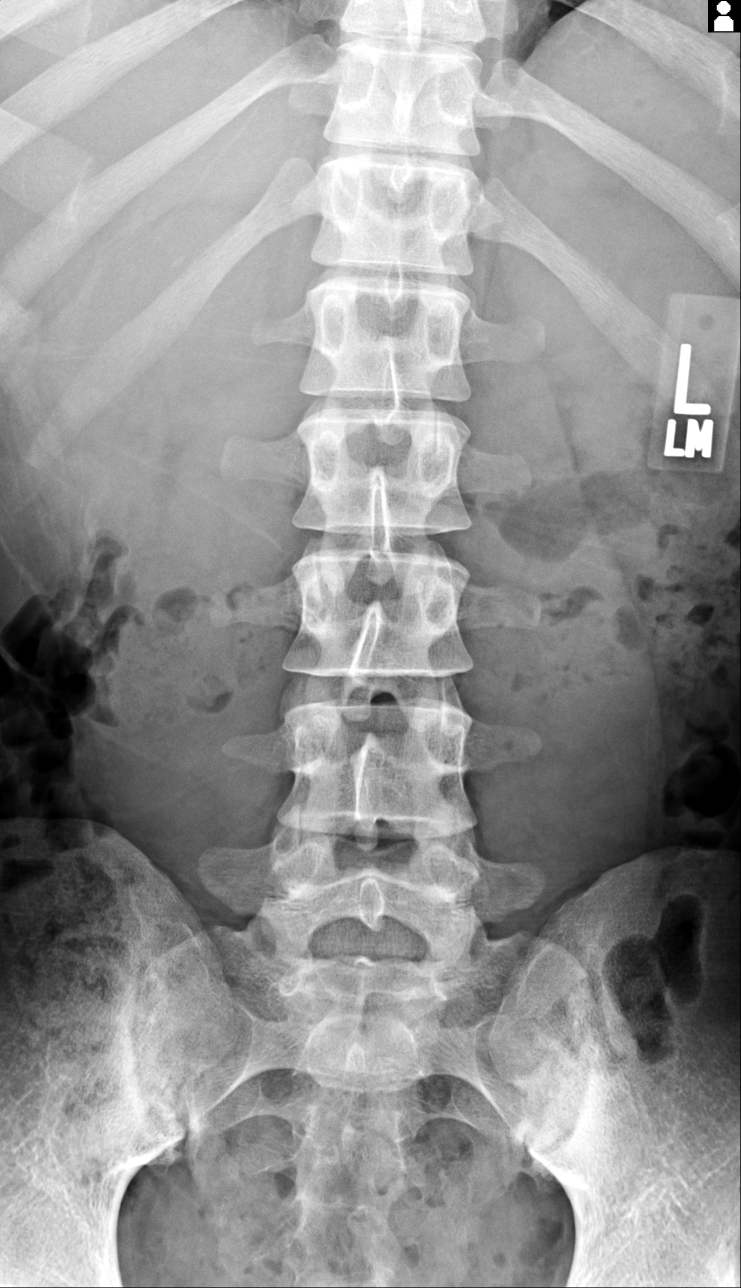
[im 2/5]
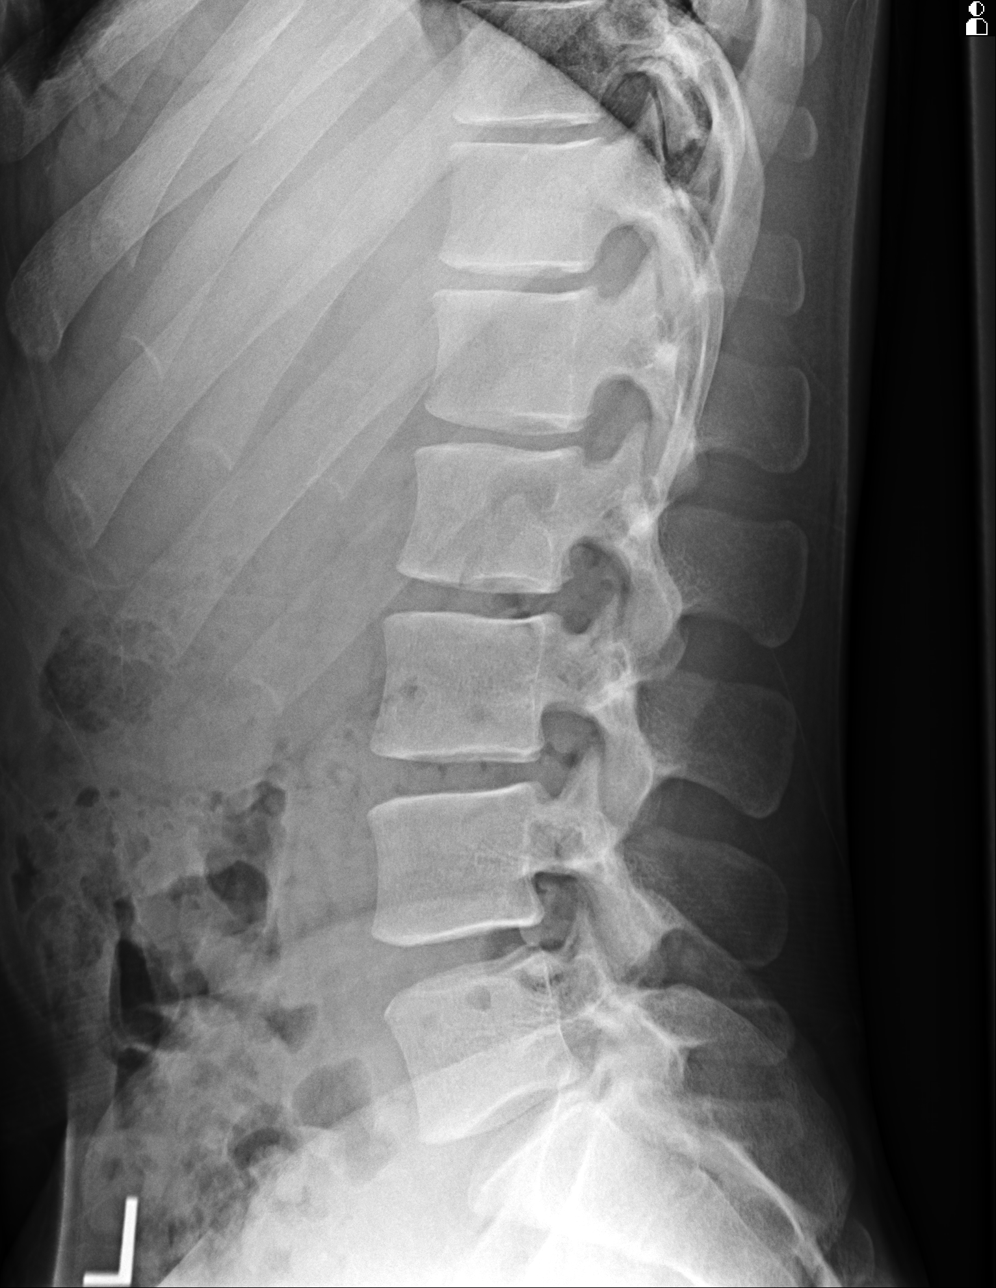
[im 3/5]
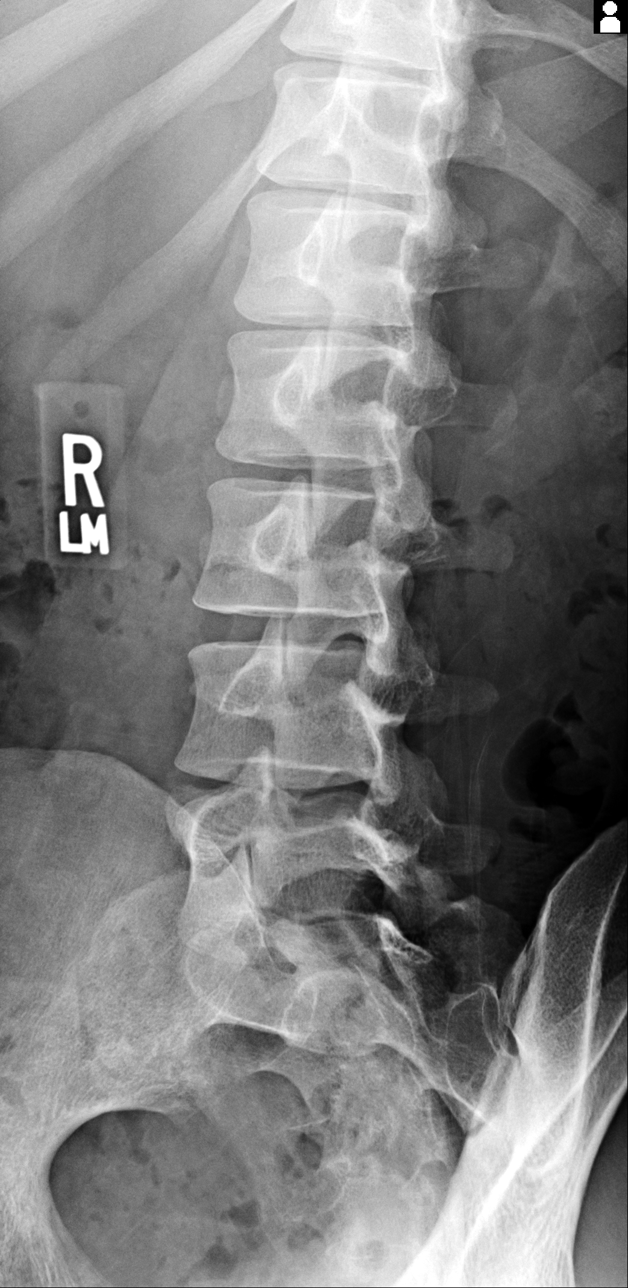
[im 4/5]
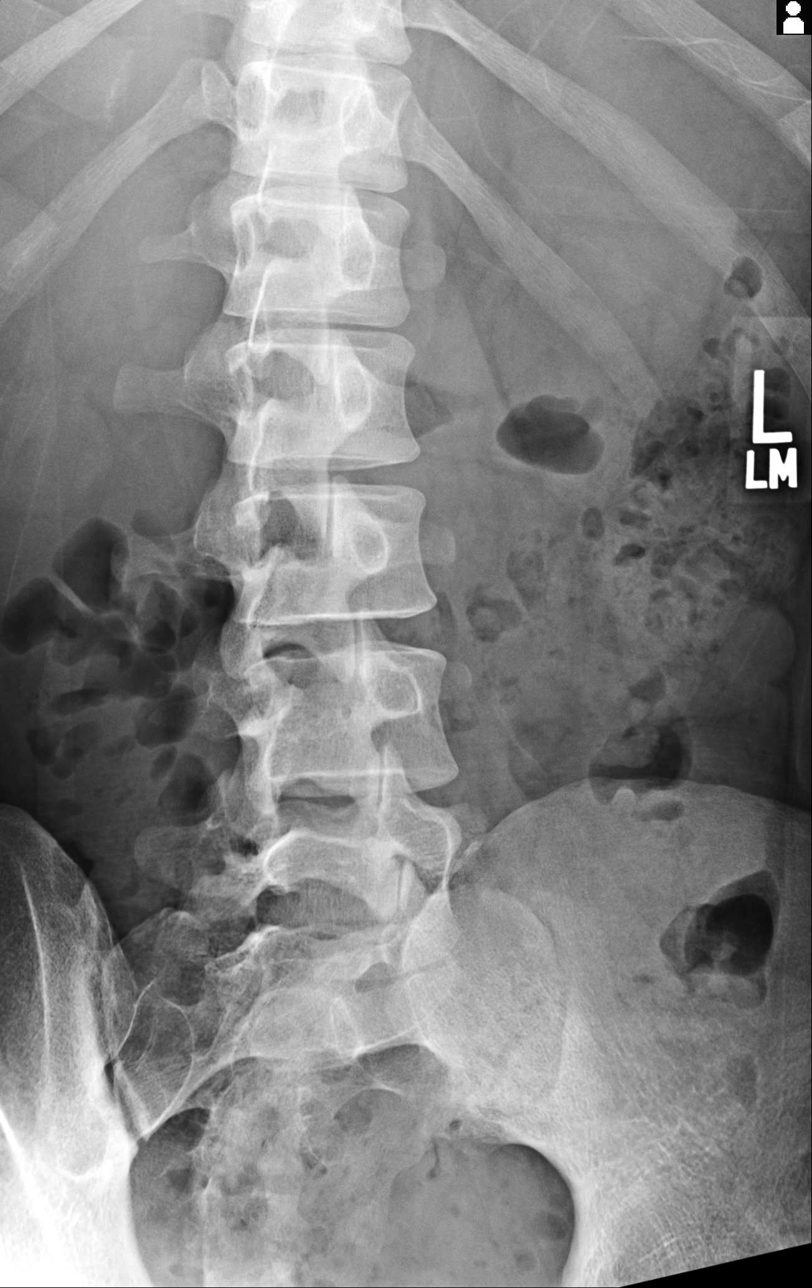
[im 5/5]
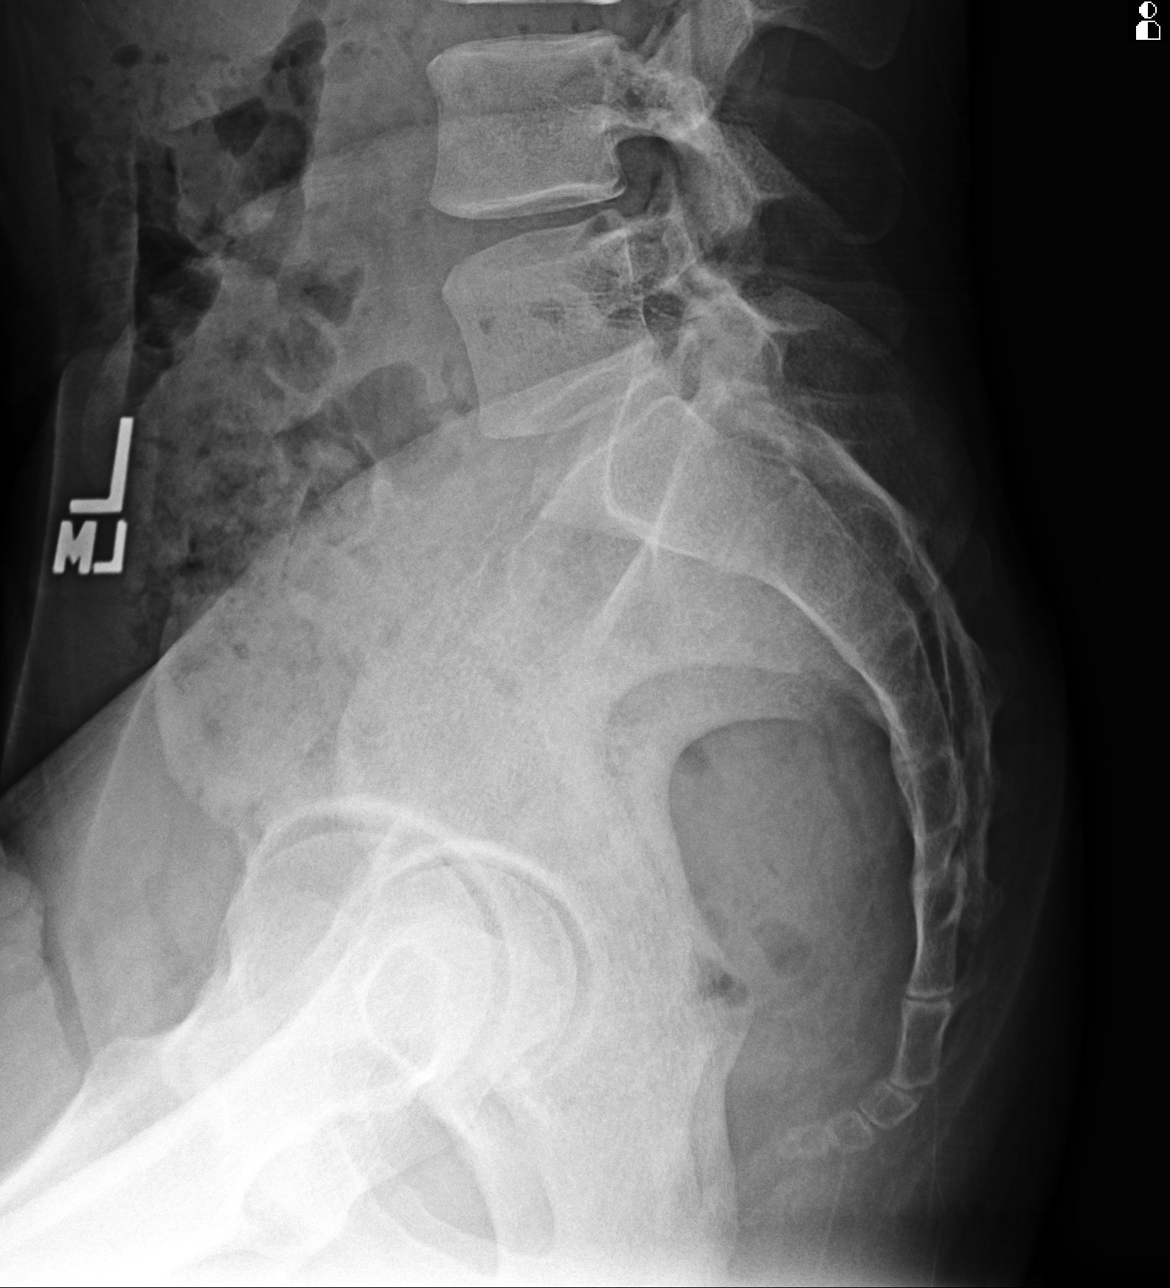

[5 of 5 positions shown; findings below may reference images not displayed]

FINDINGS: No acute fracture. No subluxation. Disc heights are intact. No facet arthrosis. Mild-moderate DJD of both SI joints.
IMPRESSION: 1.
Intact lumbar spine.

2.
Mild-moderate DJD of both SI joints.
